# Patient Record
Sex: Male | Born: 1985 | Hispanic: Yes | Smoking: Never smoker
Health system: Southern US, Community
[De-identification: ages and names within clinical notes are randomized; demographics above are authoritative.]

## PROBLEM LIST (undated history)

## (undated) DIAGNOSIS — L290 Pruritus ani: Secondary | ICD-10-CM

## (undated) HISTORY — DX: Pruritus ani: L29.0

---

## 2004-07-25 HISTORY — PX: OTHER SURGICAL HISTORY: SHX169

## 2008-05-22 ENCOUNTER — Emergency Department (HOSPITAL_COMMUNITY): Admission: EM | Admit: 2008-05-22 | Discharge: 2008-05-22 | Payer: Self-pay | Admitting: Emergency Medicine

## 2008-05-23 ENCOUNTER — Emergency Department (HOSPITAL_COMMUNITY): Admission: EM | Admit: 2008-05-23 | Discharge: 2008-05-23 | Payer: Self-pay | Admitting: Emergency Medicine

## 2009-08-29 IMAGING — CR DG CHEST 2V
2 series · 2 of 2 positions shown · non-contrast
Comparison: None

CLINICAL DATA: Fever, cough

CHEST - 2 VIEW

[w chest pa]
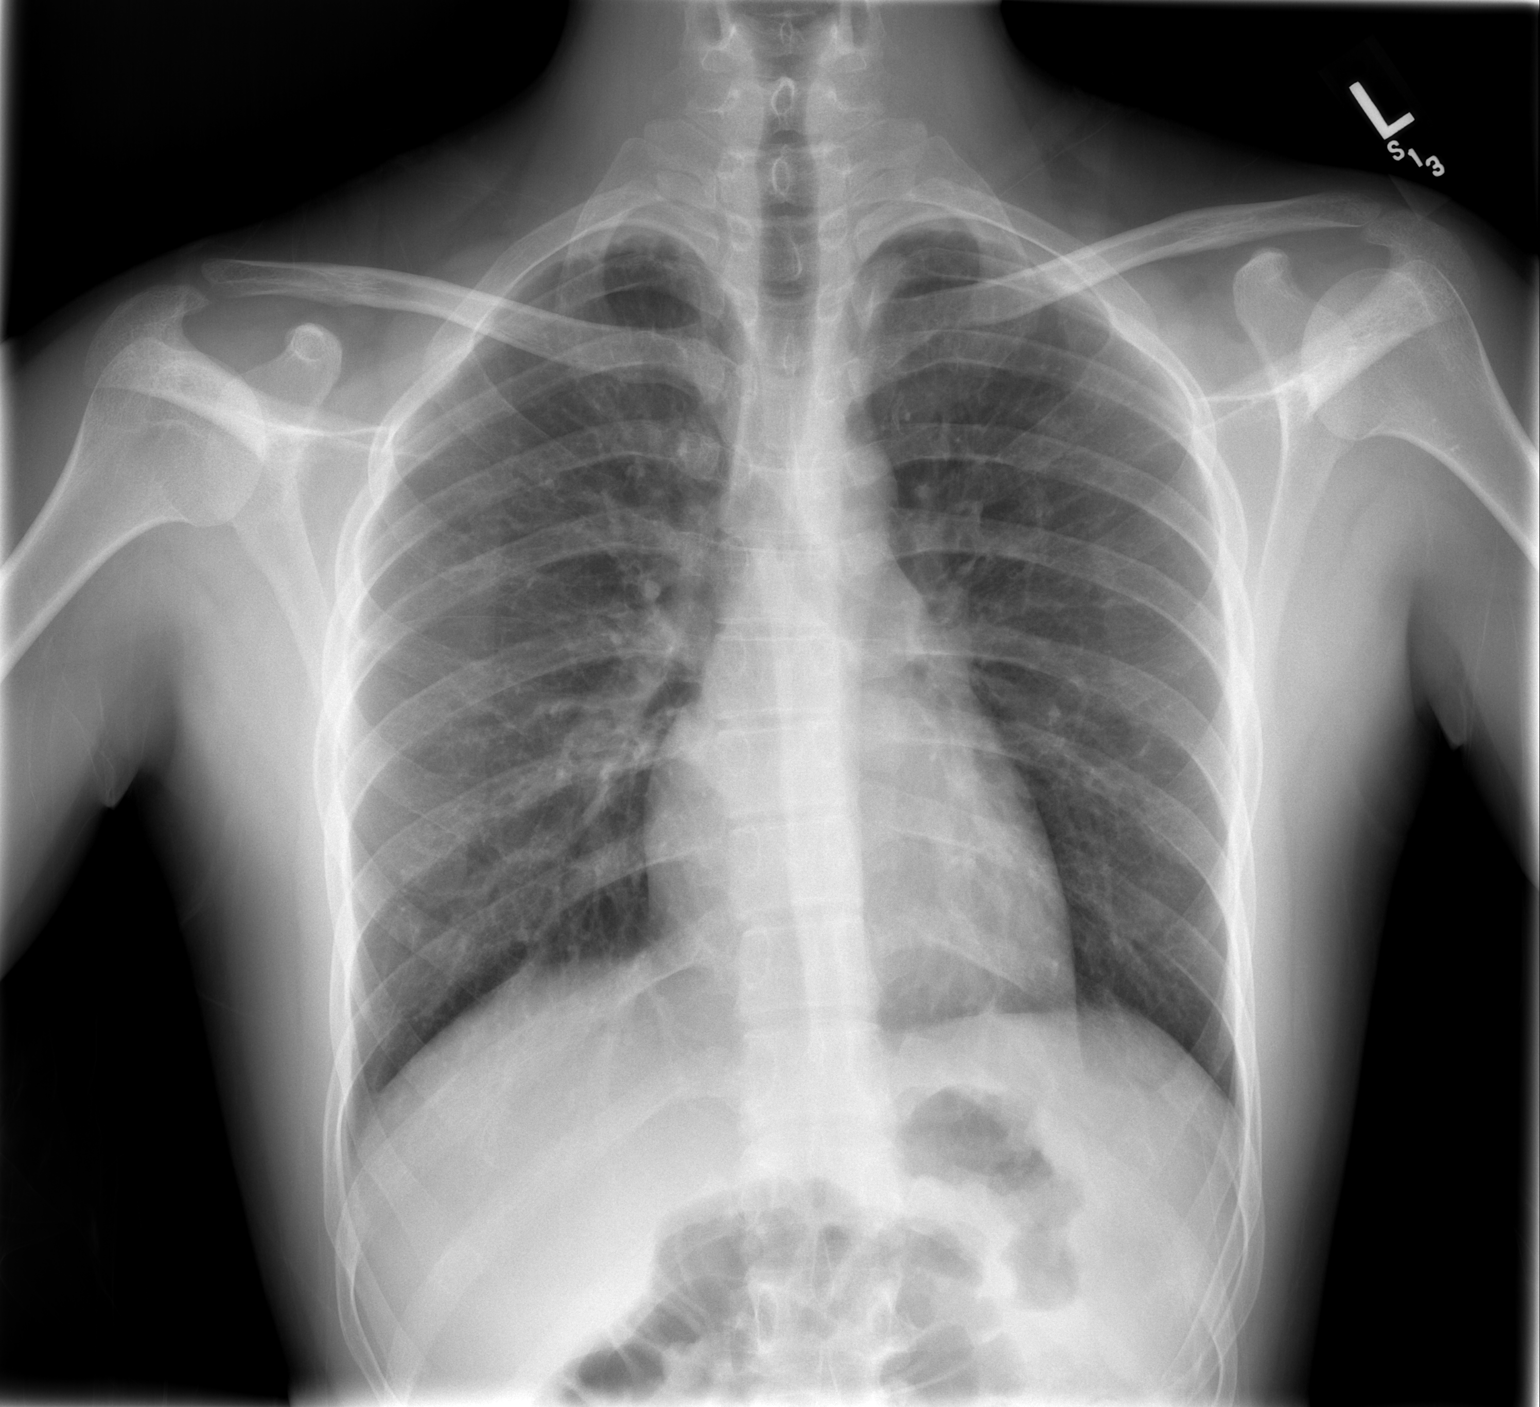

[w chest lat]
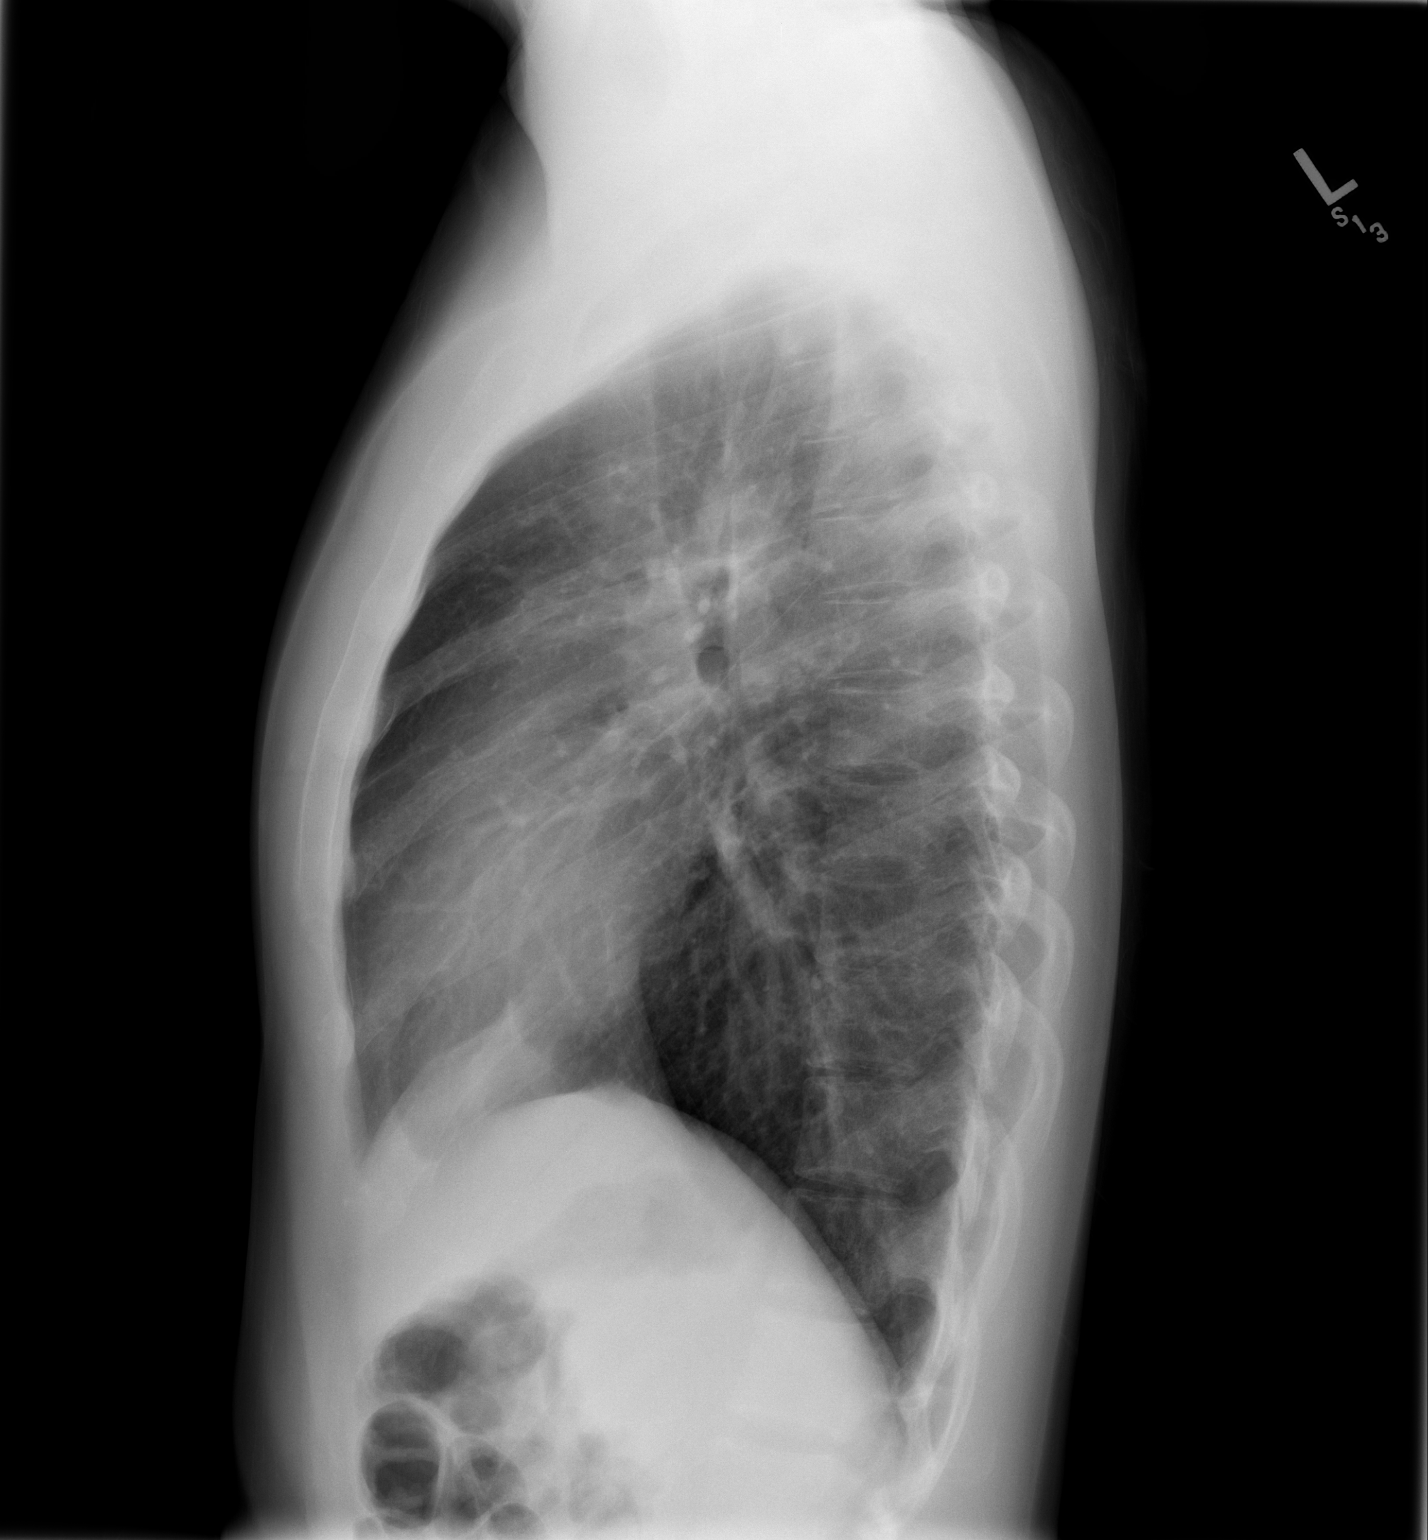

[2 of 2 positions shown; findings below may reference images not displayed]

FINDINGS: Normal mediastinum and cardiac silhouette.  Costophrenic
angles are clear.  No evidence of effusion, infiltrate or
pneumothorax.  Mild coarsened central bronchovascular markings.
IMPRESSION: Mild coarsened central bronchovascular markings could suggest viral
process.  No evidence of focal consolidation.

## 2011-11-15 ENCOUNTER — Ambulatory Visit (INDEPENDENT_AMBULATORY_CARE_PROVIDER_SITE_OTHER): Payer: Managed Care, Other (non HMO) | Admitting: Urology

## 2011-11-15 DIAGNOSIS — N411 Chronic prostatitis: Secondary | ICD-10-CM

## 2011-11-15 DIAGNOSIS — N529 Male erectile dysfunction, unspecified: Secondary | ICD-10-CM

## 2012-01-20 ENCOUNTER — Ambulatory Visit: Payer: Managed Care, Other (non HMO) | Admitting: Urology

## 2014-03-20 ENCOUNTER — Emergency Department (HOSPITAL_COMMUNITY): Payer: BC Managed Care – PPO

## 2014-03-20 ENCOUNTER — Emergency Department (HOSPITAL_COMMUNITY)
Admission: EM | Admit: 2014-03-20 | Discharge: 2014-03-20 | Disposition: A | Discharge status: Home / Self Care | Payer: BC Managed Care – PPO | Attending: Emergency Medicine | Admitting: Emergency Medicine

## 2014-03-20 ENCOUNTER — Encounter (HOSPITAL_COMMUNITY): Payer: Self-pay | Admitting: Emergency Medicine

## 2014-03-20 DIAGNOSIS — R071 Chest pain on breathing: Secondary | ICD-10-CM | POA: Insufficient documentation

## 2014-03-20 DIAGNOSIS — R079 Chest pain, unspecified: Secondary | ICD-10-CM | POA: Insufficient documentation

## 2014-03-20 DIAGNOSIS — R0789 Other chest pain: Secondary | ICD-10-CM

## 2014-03-20 LAB — I-STAT TROPONIN, ED: Troponin i, poc: 0 ng/mL (ref 0.00–0.08)

## 2014-03-20 LAB — I-STAT CHEM 8, ED
BUN: 12 mg/dL (ref 6–23)
CHLORIDE: 103 meq/L (ref 96–112)
CREATININE: 0.8 mg/dL (ref 0.50–1.35)
Calcium, Ion: 1.19 mmol/L (ref 1.12–1.23)
GLUCOSE: 100 mg/dL — AB (ref 70–99)
HCT: 47 % (ref 39.0–52.0)
HEMOGLOBIN: 16 g/dL (ref 13.0–17.0)
POTASSIUM: 3.5 meq/L — AB (ref 3.7–5.3)
SODIUM: 139 meq/L (ref 137–147)
TCO2: 25 mmol/L (ref 0–100)

## 2014-03-20 LAB — D-DIMER, QUANTITATIVE: D-Dimer, Quant: <0.27 ug/mL-FEU (ref 0.00–0.48)

## 2014-03-20 LAB — TROPONIN I

## 2014-03-20 MED ORDER — TRAMADOL HCL 50 MG PO TABS: 50.0000 mg | ORAL_TABLET | Freq: Four times a day (QID) | ORAL | Status: DC | PRN

## 2014-03-20 MED ORDER — IBUPROFEN 800 MG PO TABS: 800.0000 mg | ORAL_TABLET | Freq: Three times a day (TID) | ORAL | Status: DC

## 2014-03-20 NOTE — Discharge Instructions (Signed)

## 2014-03-20 NOTE — ED Provider Notes (Signed)
CSN: [id number]     Arrival date & time 03/20/14  1523 History   First MD Initiated Contact with Patient 03/20/14 1818     Chief Complaint  Patient presents with  . Chest Pain     (Consider location/radiation/quality/duration/timing/severity/associated sxs/prior Treatment) HPI Comments: Patient presents to ER for evaluation of chest pain. Symptoms began early this morning. Pain has been continuous through the day. Patient reports a constant pain in the left chest. He has not had any shortness of breath. Patient does, however, report that he notices the pain worsens when he takes a deep breath. Also, however, worsens if he bends and turns to the left. He denies any injury to the chest.  Patient is a 28 y.o. male presenting with chest pain.  Chest Pain   History reviewed. No pertinent past medical history. No past surgical history on file. No family history on file. History  Substance Use Topics  . Smoking status: Not on file  . Smokeless tobacco: Not on file  . Alcohol Use: Not on file    Review of Systems  Cardiovascular: Positive for chest pain.  All other systems reviewed and are negative.     Allergies  Review of patient's allergies indicates no known allergies.  Home Medications   Prior to Admission medications   Not on File   BP 122/73  Pulse 67  Temp(Src) 98.5 F (36.9 C) (Oral)  Resp 20  SpO2 99% Physical Exam  Constitutional: He is oriented to person, place, and time. He appears well-developed and well-nourished. No distress.  HENT:  Head: Normocephalic and atraumatic.  Right Ear: Hearing normal.  Left Ear: Hearing normal.  Nose: Nose normal.  Mouth/Throat: Oropharynx is clear and moist and mucous membranes are normal.  Eyes: Conjunctivae and EOM are normal. Pupils are equal, round, and reactive to light.  Neck: Normal range of motion. Neck supple.  Cardiovascular: Regular rhythm, S1 normal and S2 normal.  Exam reveals no gallop and no friction rub.    No murmur heard. Pulmonary/Chest: Effort normal and breath sounds normal. No respiratory distress. He exhibits no tenderness.  Abdominal: Soft. Normal appearance and bowel sounds are normal. There is no hepatosplenomegaly. There is no tenderness. There is no rebound, no guarding, no tenderness at McBurney's point and negative Murphy's sign. No hernia.  Musculoskeletal: Normal range of motion.  Neurological: He is alert and oriented to person, place, and time. He has normal strength. No cranial nerve deficit or sensory deficit. Coordination normal. GCS eye subscore is 4. GCS verbal subscore is 5. GCS motor subscore is 6.  Skin: Skin is warm, dry and intact. No rash noted. No cyanosis.  Psychiatric: He has a normal mood and affect. His speech is normal and behavior is normal. Thought content normal.    ED Course  Procedures (including critical care time) Labs Review Labs Reviewed  I-STAT CHEM 8, ED - Abnormal; Notable for the following:    Potassium 3.5 (*)    Glucose, Bld 100 (*)    All other components within normal limits  D-DIMER, QUANTITATIVE  TROPONIN I  I-STAT TROPOININ, ED    Imaging Review Dg Chest 2 View  03/20/2014   CLINICAL DATA:  Left chest pain  EXAM: CHEST  2 VIEW  COMPARISON:  05/23/2008  FINDINGS: Heart size is normal. Vascularity normal. Negative for infiltrate or effusion. Slightly prominent lung markings as noted previously appear chronic. Apical pleural scarring bilaterally also unchanged.  IMPRESSION: No active cardiopulmonary disease.   Electronically Signed  By: Marlan Palau M.D.   On: 03/20/2014 18:10     EKG Interpretation   Date/Time:  Thursday March 20 2014 15:32:07 EDT Ventricular Rate:  59 PR Interval:  126 QRS Duration: 86 QT Interval:  406 QTC Calculation: 401 R Axis:   73 Text Interpretation:  Sinus bradycardia Otherwise normal ECG Confirmed by  POLLINA  MD, CHRISTOPHER [phone]) on 03/20/2014 6:19:00 PM      MDM   Final diagnoses:   Chest wall pain    Patient presents to the ER for evaluation of chest pain. Patient has no cardiac risk factors. I was not able to reproduce the pain by palpation, but it is reproducible with turning and bending over to the left side. This is most consistent with musculoskeletal chest pain. The patient's EKG is normal. Troponin was negative after pain lasting the entire day. D-dimer negative. Well's/PERC negative. He is reassured, treat for musculoskeletal chest pain. Return if symptoms worsen.    Gilda Crease, MD 03/20/14 2226

## 2014-03-20 NOTE — ED Notes (Signed)
Pt in c/o central chest pain since this morning, denies other symptoms, no distress noted, went to an urgent care and was told to come here for further evaluation

## 2014-03-20 NOTE — ED Notes (Signed)
Spoke with MD Gwendolyn Grant and he stated to wait for patient to be evaluated to order blood work

## 2014-10-20 ENCOUNTER — Encounter: Payer: Self-pay | Admitting: Family Medicine

## 2014-10-20 ENCOUNTER — Ambulatory Visit (INDEPENDENT_AMBULATORY_CARE_PROVIDER_SITE_OTHER): Payer: BLUE CROSS/BLUE SHIELD | Admitting: Family Medicine

## 2014-10-20 VITALS — BP 127/85 | HR 66 | Temp 98.6°F | Ht 66.0 in | Wt 144.0 lb

## 2014-10-20 DIAGNOSIS — K602 Anal fissure, unspecified: Secondary | ICD-10-CM | POA: Diagnosis not present

## 2014-10-20 DIAGNOSIS — L290 Pruritus ani: Secondary | ICD-10-CM

## 2014-10-20 MED ORDER — HYDROCORTISONE 2.5 % RE CREA: TOPICAL_CREAM | RECTAL | Status: DC

## 2014-10-20 MED ORDER — DOCUSATE SODIUM 100 MG PO CAPS: ORAL_CAPSULE | ORAL | Status: DC

## 2014-10-20 NOTE — Progress Notes (Signed)
Pre visit review using our clinic review tool, if applicable. No additional management support is needed unless otherwise documented below in the visit note. 
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
## 2014-10-20 NOTE — Patient Instructions (Signed)
Avoid tight-fitting underwear, keep anal area as dry as possible.  Limit intake or avoid intake of tea, tomatoes, citrus fruits, chocolate, cola, coffee, and beer: these substances can sometimes cause worsening of anal itching.

## 2014-12-01 ENCOUNTER — Encounter: Payer: Self-pay | Admitting: Family Medicine

## 2014-12-01 ENCOUNTER — Ambulatory Visit (INDEPENDENT_AMBULATORY_CARE_PROVIDER_SITE_OTHER): Payer: BLUE CROSS/BLUE SHIELD | Admitting: Family Medicine

## 2014-12-01 VITALS — BP 132/78 | HR 78 | Temp 99.2°F | Resp 16 | Wt 147.0 lb

## 2014-12-01 DIAGNOSIS — L290 Pruritus ani: Secondary | ICD-10-CM

## 2014-12-01 NOTE — Progress Notes (Signed)
OFFICE NOTE  12/01/2014  CC:  Chief Complaint  Patient presents with  . Follow-up    6 week f/u     HPI: Patient is a 29 y.o. Hispanic male who is here for 5 week f/u anal pruritis. Doing better.  Changed to hypoallergenic/nonscented soap + using anusol cream avg 2-3 times per week and gets decent relief of sx's.  Gets occ BRB still but less often than before.    No hard/large BMs.  Pertinent PMH:  Past medical, surgical, social, and family history reviewed and no changes are noted since last office visit.  MEDS:  Outpatient Prescriptions Prior to Visit  Medication Sig Dispense Refill  . docusate sodium (COLACE) 100 MG capsule 2 caps po qd 60 capsule 11  . hydrocortisone (ANUSOL-HC) 2.5 % rectal cream Apply to anal area bid prn itching 30 g 5   No facility-administered medications prior to visit.    PE: Blood pressure 132/78, pulse 78, temperature 99.2 F (37.3 C), temperature source Temporal, resp. rate 16, weight 147 lb (66.679 kg), SpO2 95 %. Gen: Alert, well appearing.  Patient is oriented to person, place, time, and situation. No further exam today.  IMPRESSION AND PLAN:  1) Anal pruritis: improved. Continue current measures. He has rf's on anusol x 5.  An After Visit Summary was printed and given to the patient.  FOLLOW UP: prn

## 2014-12-01 NOTE — Progress Notes (Signed)
Pre visit review using our clinic review tool, if applicable. No additional management support is needed unless otherwise documented below in the visit note. 

## 2015-06-26 IMAGING — CR DG CHEST 2V
2 series · 2 of 2 positions shown · non-contrast
Comparison: 05/23/2008

CLINICAL DATA: Left chest pain

EXAM:
CHEST  2 VIEW

[w chest pa]
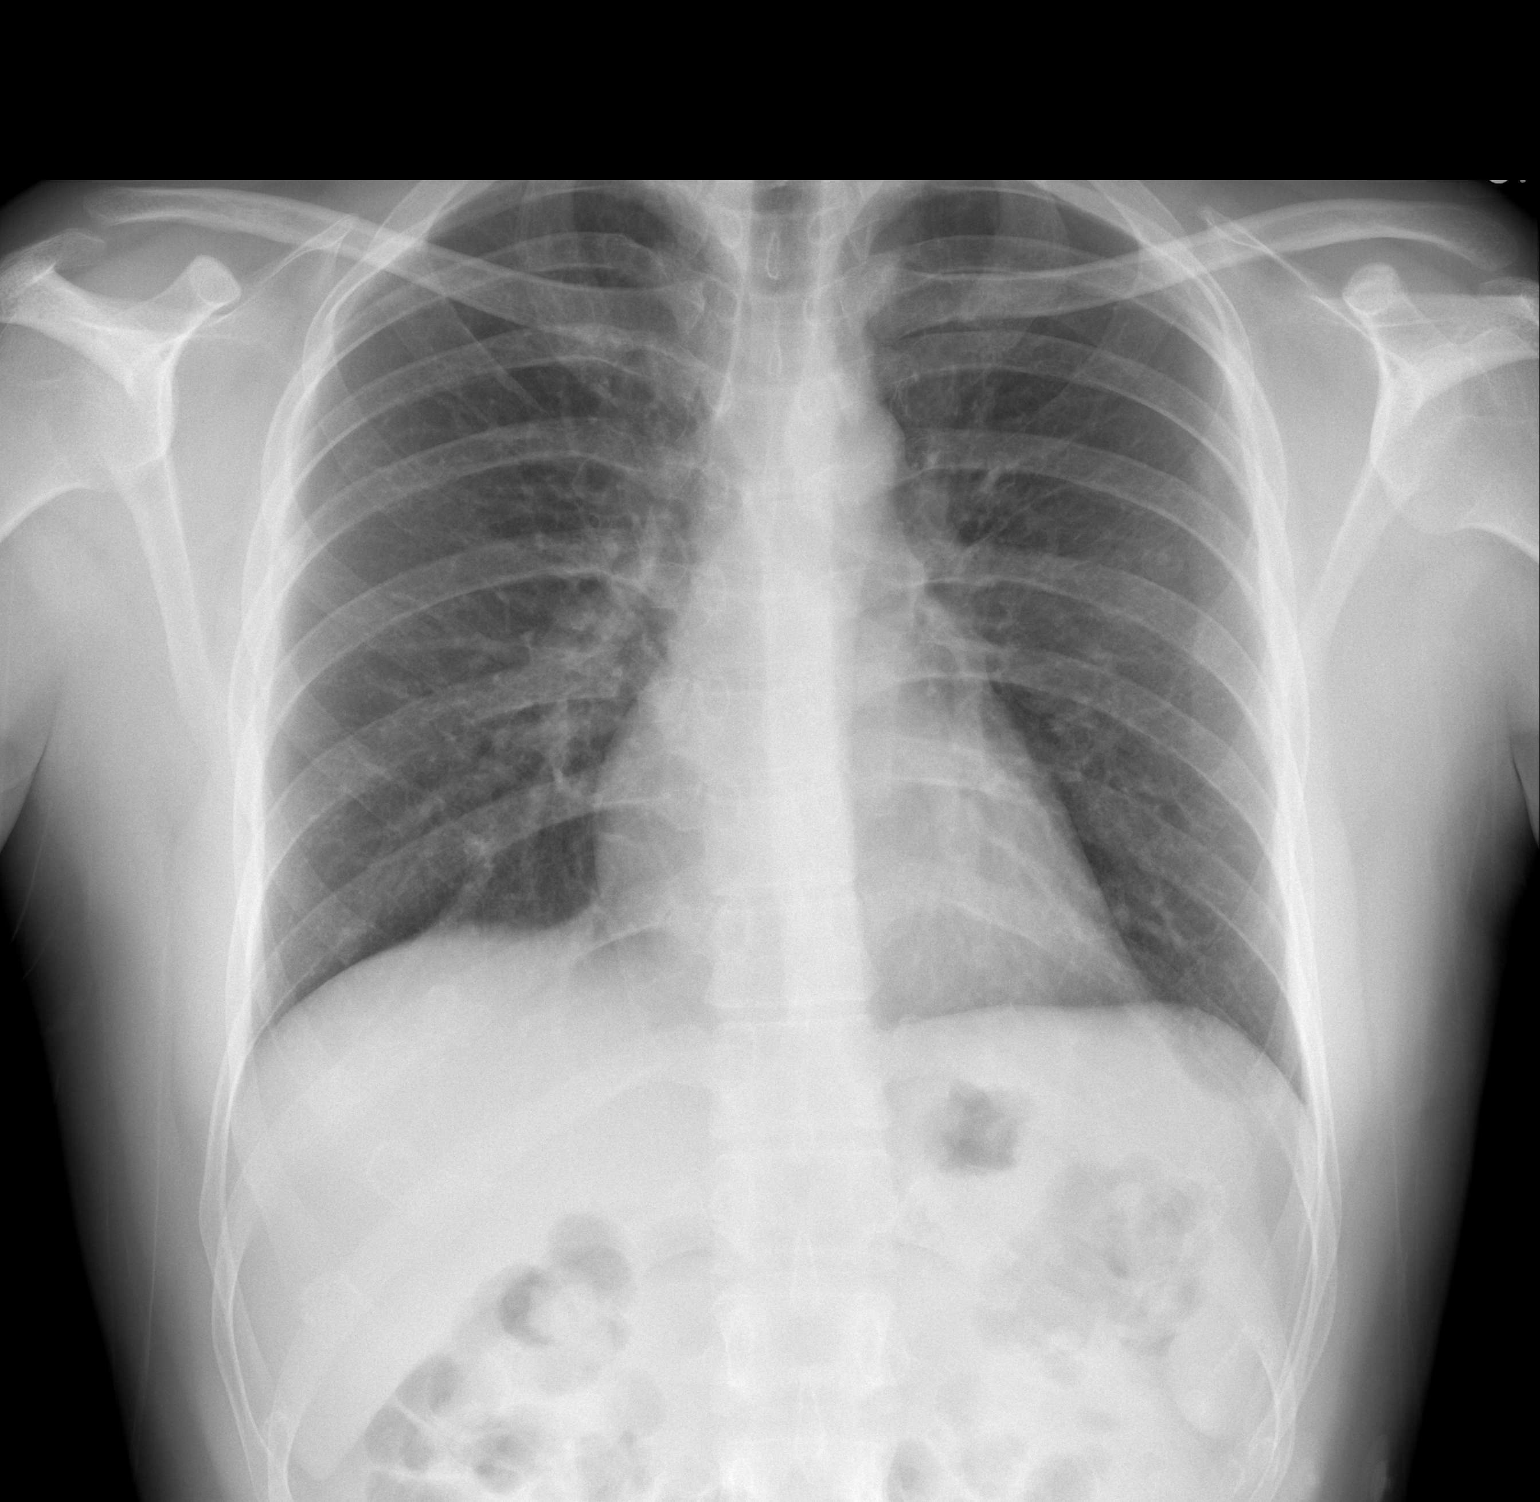

[w chest lat]
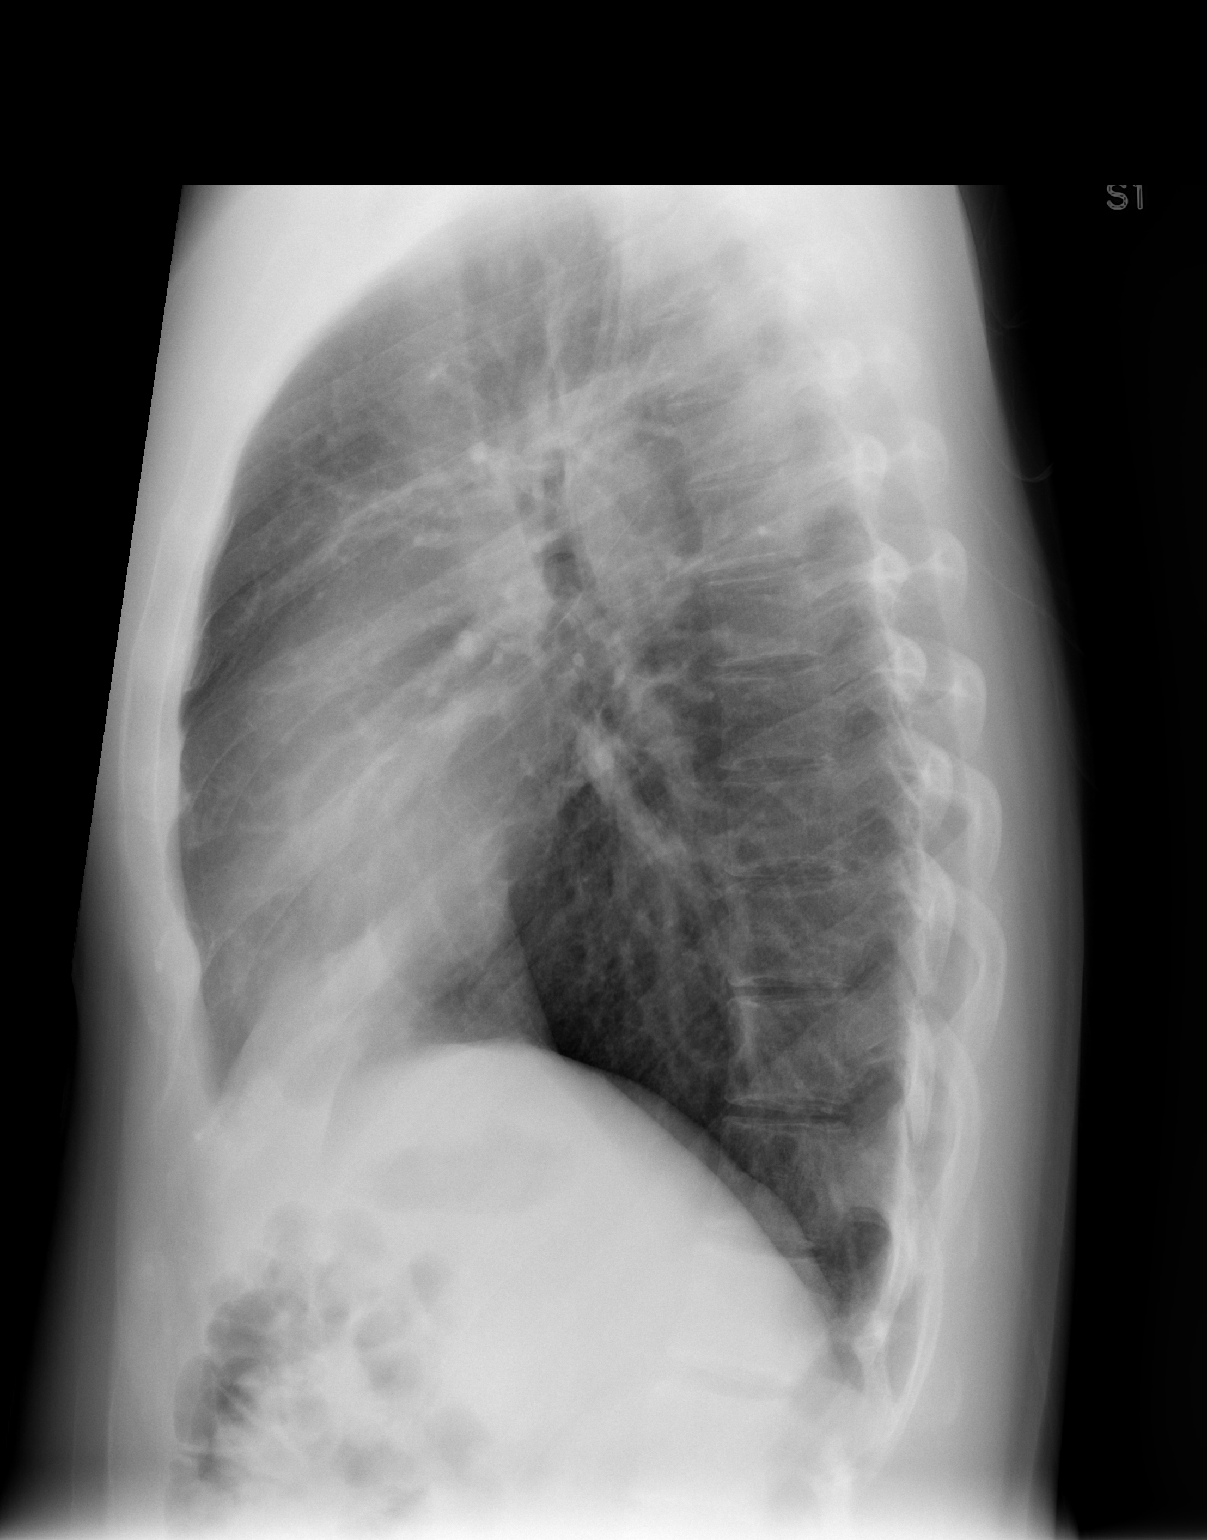

[2 of 2 positions shown; findings below may reference images not displayed]

FINDINGS: Heart size is normal. Vascularity normal. Negative for infiltrate or
effusion. Slightly prominent lung markings as noted previously
appear chronic. Apical pleural scarring bilaterally also unchanged.
IMPRESSION: No active cardiopulmonary disease.

## 2017-01-08 DIAGNOSIS — S92144A Nondisplaced dome fracture of right talus, initial encounter for closed fracture: Secondary | ICD-10-CM | POA: Diagnosis not present

## 2017-01-11 DIAGNOSIS — S92254A Nondisplaced fracture of navicular [scaphoid] of right foot, initial encounter for closed fracture: Secondary | ICD-10-CM | POA: Diagnosis not present

## 2017-02-10 DIAGNOSIS — S92254D Nondisplaced fracture of navicular [scaphoid] of right foot, subsequent encounter for fracture with routine healing: Secondary | ICD-10-CM | POA: Diagnosis not present

## 2017-04-05 DIAGNOSIS — S92254D Nondisplaced fracture of navicular [scaphoid] of right foot, subsequent encounter for fracture with routine healing: Secondary | ICD-10-CM | POA: Diagnosis not present

## 2017-05-03 DIAGNOSIS — R509 Fever, unspecified: Secondary | ICD-10-CM | POA: Diagnosis not present

## 2017-05-15 DIAGNOSIS — R509 Fever, unspecified: Secondary | ICD-10-CM | POA: Diagnosis not present

## 2017-06-08 ENCOUNTER — Ambulatory Visit (INDEPENDENT_AMBULATORY_CARE_PROVIDER_SITE_OTHER): Payer: BLUE CROSS/BLUE SHIELD | Admitting: Adult Health

## 2017-06-08 ENCOUNTER — Encounter: Payer: Self-pay | Admitting: Adult Health

## 2017-06-08 VITALS — BP 140/90 | Temp 98.3°F | Ht 66.0 in | Wt 150.0 lb

## 2017-06-08 DIAGNOSIS — Z7689 Persons encountering health services in other specified circumstances: Secondary | ICD-10-CM

## 2017-06-08 DIAGNOSIS — N529 Male erectile dysfunction, unspecified: Secondary | ICD-10-CM

## 2017-06-08 MED ORDER — NYSTATIN 100000 UNIT/GM EX CREA: 1.0000 "application " | TOPICAL_CREAM | Freq: Two times a day (BID) | CUTANEOUS | 1 refills | Status: DC

## 2017-06-08 NOTE — Patient Instructions (Signed)
It was great meeting you today   Please start exercising and eating healthier   Follow up with me for your physical exam
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
## 2017-06-14 ENCOUNTER — Encounter: Payer: BLUE CROSS/BLUE SHIELD | Admitting: Adult Health

## 2017-07-05 ENCOUNTER — Encounter: Payer: BLUE CROSS/BLUE SHIELD | Admitting: Adult Health

## 2017-07-20 ENCOUNTER — Encounter: Payer: Self-pay | Admitting: Adult Health

## 2017-07-20 ENCOUNTER — Ambulatory Visit (INDEPENDENT_AMBULATORY_CARE_PROVIDER_SITE_OTHER): Payer: BLUE CROSS/BLUE SHIELD | Admitting: Adult Health

## 2017-07-20 VITALS — BP 116/70 | Temp 98.5°F | Ht 66.0 in | Wt 149.0 lb

## 2017-07-20 DIAGNOSIS — Z0000 Encounter for general adult medical examination without abnormal findings: Secondary | ICD-10-CM

## 2017-07-20 DIAGNOSIS — Z114 Encounter for screening for human immunodeficiency virus [HIV]: Secondary | ICD-10-CM | POA: Diagnosis not present

## 2017-07-20 DIAGNOSIS — N529 Male erectile dysfunction, unspecified: Secondary | ICD-10-CM | POA: Diagnosis not present

## 2017-07-20 LAB — BASIC METABOLIC PANEL
BUN: 19 mg/dL (ref 6–23)
CALCIUM: 9.7 mg/dL (ref 8.4–10.5)
CHLORIDE: 101 meq/L (ref 96–112)
CO2: 31 meq/L (ref 19–32)
Creatinine, Ser: 0.83 mg/dL (ref 0.40–1.50)
GFR: 113.5 mL/min (ref >60.00)
Glucose, Bld: 100 mg/dL — ABNORMAL HIGH (ref 70–99)
Potassium: 4.7 mEq/L (ref 3.5–5.1)
SODIUM: 139 meq/L (ref 135–145)

## 2017-07-20 LAB — CBC WITH DIFFERENTIAL/PLATELET
BASOS ABS: 0 10*3/uL (ref 0.0–0.1)
Basophils Relative: 0.3 % (ref 0.0–3.0)
Eosinophils Absolute: 0.1 10*3/uL (ref 0.0–0.7)
Eosinophils Relative: 1 % (ref 0.0–5.0)
HEMATOCRIT: 48.2 % (ref 39.0–52.0)
HEMOGLOBIN: 15.5 g/dL (ref 13.0–17.0)
LYMPHS PCT: 37.3 % (ref 12.0–46.0)
Lymphs Abs: 3.4 10*3/uL (ref 0.7–4.0)
MCHC: 32.2 g/dL (ref 30.0–36.0)
MCV: 82.5 fl (ref 78.0–100.0)
MONOS PCT: 7.6 % (ref 3.0–12.0)
Monocytes Absolute: 0.7 10*3/uL (ref 0.1–1.0)
NEUTROS ABS: 4.9 10*3/uL (ref 1.4–7.7)
Neutrophils Relative %: 53.8 % (ref 43.0–77.0)
PLATELETS: 250 10*3/uL (ref 150.0–400.0)
RBC: 5.84 Mil/uL — AB (ref 4.22–5.81)
RDW: 14 % (ref 11.5–15.5)
WBC: 9 10*3/uL (ref 4.0–10.5)

## 2017-07-20 LAB — HEPATIC FUNCTION PANEL
ALBUMIN: 4.6 g/dL (ref 3.5–5.2)
ALK PHOS: 54 U/L (ref 39–117)
ALT: 25 U/L (ref 0–53)
AST: 17 U/L (ref 0–37)
BILIRUBIN DIRECT: 0.1 mg/dL (ref 0.0–0.3)
TOTAL PROTEIN: 7.1 g/dL (ref 6.0–8.3)
Total Bilirubin: 0.6 mg/dL (ref 0.2–1.2)

## 2017-07-20 LAB — TSH: TSH: 1.12 u[IU]/mL (ref 0.35–4.50)

## 2017-07-20 LAB — HEMOGLOBIN A1C: HEMOGLOBIN A1C: 5.9 % (ref 4.6–6.5)

## 2017-07-20 LAB — LIPID PANEL
CHOL/HDL RATIO: 5
Cholesterol: 203 mg/dL — ABNORMAL HIGH (ref 0–200)
HDL: 42.7 mg/dL (ref >39.00)
LDL Cholesterol: 130 mg/dL — ABNORMAL HIGH (ref 0–99)
NONHDL: 159.84
Triglycerides: 147 mg/dL (ref 0.0–149.0)
VLDL: 29.4 mg/dL (ref 0.0–40.0)
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
## 2017-07-20 NOTE — Patient Instructions (Signed)
It was great seeing you today   I will follow up with you regarding your labs   Please let me know if you need anything   Health Maintenance, Male A healthy lifestyle and preventative care can promote health and wellness.  Maintain regular health, dental, and eye exams.  Eat a healthy diet. Foods like vegetables, fruits, whole grains, low-fat dairy products, and lean protein foods contain the nutrients you need and are low in calories. Decrease your intake of foods high in solid fats, added sugars, and salt. Get information about a proper diet from your health care provider, if necessary.  Regular physical exercise is one of the most important things you can do for your health. Most adults should get at least 150 minutes of moderate-intensity exercise (any activity that increases your heart rate and causes you to sweat) each week. In addition, most adults need muscle-strengthening exercises on 2 or more days a week.   Maintain a healthy weight. The body mass index (BMI) is a screening tool to identify possible weight problems. It provides an estimate of body fat based on height and weight. Your health care provider can find your BMI and can help you achieve or maintain a healthy weight. For males 20 years and older:  A BMI below 18.5 is considered underweight.  A BMI of 18.5 to 24.9 is normal.  A BMI of 25 to 29.9 is considered overweight.  A BMI of 30 and above is considered obese.  Maintain normal blood lipids and cholesterol by exercising and minimizing your intake of saturated fat. Eat a balanced diet with plenty of fruits and vegetables. Blood tests for lipids and cholesterol should begin at age 32 and be repeated every 5 years. If your lipid or cholesterol levels are high, you are over age 32, or you are at high risk for heart disease, you may need your cholesterol levels checked more frequently.Ongoing high lipid and cholesterol levels should be treated with medicines if diet and  exercise are not working.  If you smoke, find out from your health care provider how to quit. If you do not use tobacco, do not start.  Lung cancer screening is recommended for adults aged 55-80 years who are at high risk for developing lung cancer because of a history of smoking. A yearly low-dose CT scan of the lungs is recommended for people who have at least a 30-pack-year history of smoking and are current smokers or have quit within the past 15 years. A pack year of smoking is smoking an average of 1 pack of cigarettes a day for 1 year (for example, a 30-pack-year history of smoking could mean smoking 1 pack a day for 30 years or 2 packs a day for 15 years). Yearly screening should continue until the smoker has stopped smoking for at least 15 years. Yearly screening should be stopped for people who develop a health problem that would prevent them from having lung cancer treatment.  If you choose to drink alcohol, do not have more than 2 drinks per day. One drink is considered to be 12 oz (360 mL) of beer, 5 oz (150 mL) of wine, or 1.5 oz (45 mL) of liquor.  Avoid the use of street drugs. Do not share needles with anyone. Ask for help if you need support or instructions about stopping the use of drugs.  High blood pressure causes heart disease and increases the risk of stroke. High blood pressure is more likely to develop in:  People who have blood pressure in the end of the normal range (100-139/85-89 mm Hg).  People who are overweight or obese.  People who are African American.  If you are 65-61 years of age, have your blood pressure checked every 3-5 years. If you are 68 years of age or older, have your blood pressure checked every year. You should have your blood pressure measured twice--once when you are at a hospital or clinic, and once when you are not at a hospital or clinic. Record the average of the two measurements. To check your blood pressure when you are not at a hospital or  clinic, you can use:  An automated blood pressure machine at a pharmacy.  A home blood pressure monitor.  If you are 21-21 years old, ask your health care provider if you should take aspirin to prevent heart disease.  Diabetes screening involves taking a blood sample to check your fasting blood sugar level. This should be done once every 3 years after age 67 if you are at a normal weight and without risk factors for diabetes. Testing should be considered at a younger age or be carried out more frequently if you are overweight and have at least 1 risk factor for diabetes.  Colorectal cancer can be detected and often prevented. Most routine colorectal cancer screening begins at the age of 24 and continues through age 7. However, your health care provider may recommend screening at an earlier age if you have risk factors for colon cancer. On a yearly basis, your health care provider may provide home test kits to check for hidden blood in the stool. A small camera at the end of a tube may be used to directly examine the colon (sigmoidoscopy or colonoscopy) to detect the earliest forms of colorectal cancer. Talk to your health care provider about this at age 24 when routine screening begins. A direct exam of the colon should be repeated every 5-10 years through age 61, unless early forms of precancerous polyps or small growths are found.  People who are at an increased risk for hepatitis B should be screened for this virus. You are considered at high risk for hepatitis B if:  You were born in a country where hepatitis B occurs often. Talk with your health care provider about which countries are considered high risk.  Your parents were born in a high-risk country and you have not received a shot to protect against hepatitis B (hepatitis B vaccine).  You have HIV or AIDS.  You use needles to inject street drugs.  You live with, or have sex with, someone who has hepatitis B.  You are a man who has  sex with other men (MSM).  You get hemodialysis treatment.  You take certain medicines for conditions like cancer, organ transplantation, and autoimmune conditions.  Hepatitis C blood testing is recommended for all people born from [birth year] through 1966 and any individual with known risk factors for hepatitis C.  Healthy men should no longer receive prostate-specific antigen (PSA) blood tests as part of routine cancer screening. Talk to your health care provider about prostate cancer screening.  Testicular cancer screening is not recommended for adolescents or adult males who have no symptoms. Screening includes self-exam, a health care provider exam, and other screening tests. Consult with your health care provider about any symptoms you have or any concerns you have about testicular cancer.  Practice safe sex. Use condoms and avoid high-risk sexual practices to reduce the spread of  sexually transmitted infections (STIs).  You should be screened for STIs, including gonorrhea and chlamydia if:  You are sexually active and are younger than 24 years.  You are older than 24 years, and your health care provider tells you that you are at risk for this type of infection.  Your sexual activity has changed since you were last screened, and you are at an increased risk for chlamydia or gonorrhea. Ask your health care provider if you are at risk.  If you are at risk of being infected with HIV, it is recommended that you take a prescription medicine daily to prevent HIV infection. This is called pre-exposure prophylaxis (PrEP). You are considered at risk if:  You are a man who has sex with other men (MSM).  You are a heterosexual man who is sexually active with multiple partners.  You take drugs by injection.  You are sexually active with a partner who has HIV.  Talk with your health care provider about whether you are at high risk of being infected with HIV. If you choose to begin PrEP, you should  first be tested for HIV. You should then be tested every 3 months for as long as you are taking PrEP.  Use sunscreen. Apply sunscreen liberally and repeatedly throughout the day. You should seek shade when your shadow is shorter than you. Protect yourself by wearing long sleeves, pants, a wide-brimmed hat, and sunglasses year round whenever you are outdoors.  Tell your health care provider of new moles or changes in moles, especially if there is a change in shape or color. Also, tell your health care provider if a mole is larger than the size of a pencil eraser.  A one-time screening for abdominal aortic aneurysm (AAA) and surgical repair of large AAAs by ultrasound is recommended for men aged 52-75 years who are current or former smokers.  Stay current with your vaccines (immunizations).   This information is not intended to replace advice given to you by your health care provider. Make sure you discuss any questions you have with your health care provider.   Document Released: 01/07/2008 Document Revised: 08/01/2014 Document Reviewed: 12/06/2010 Elsevier Interactive Patient Education [employer].

## 2017-07-20 NOTE — Addendum Note (Signed)
Addended by: Bonnye FavaKWEI, Graciela Plato K on: 07/20/2017 07:39 AM   Modules accepted: Orders

## 2017-07-21 ENCOUNTER — Telehealth: Payer: Self-pay | Admitting: Adult Health

## 2017-07-21 LAB — TESTOSTERONE TOTAL,FREE,BIO, MALES
Albumin: 4.6 g/dL (ref 3.6–5.1)
Sex Hormone Binding: 17 nmol/L (ref 10–50)
Testosterone, Bioavailable: 151.6 ng/dL (ref >=110.0)
Testosterone, Free: 72.2 pg/mL (ref 46.0–224.0)
Testosterone: 348 ng/dL (ref 250–827)

## 2017-07-21 LAB — HIV ANTIBODY (ROUTINE TESTING W REFLEX): HIV: NONREACTIVE

## 2017-07-21 NOTE — Telephone Encounter (Signed)
Updated patient on his labs  

## 2017-09-06 ENCOUNTER — Ambulatory Visit (INDEPENDENT_AMBULATORY_CARE_PROVIDER_SITE_OTHER): Payer: BLUE CROSS/BLUE SHIELD | Admitting: Family Medicine

## 2017-09-06 ENCOUNTER — Encounter: Payer: Self-pay | Admitting: Family Medicine

## 2017-09-06 VITALS — BP 124/66 | HR 73 | Temp 98.2°F | Resp 12 | Ht 66.0 in | Wt 143.0 lb

## 2017-09-06 DIAGNOSIS — L639 Alopecia areata, unspecified: Secondary | ICD-10-CM

## 2017-09-06 MED ORDER — DESOXIMETASONE 0.25 % EX CREA: 1.0000 "application " | TOPICAL_CREAM | Freq: Two times a day (BID) | CUTANEOUS | 0 refills | Status: DC

## 2017-09-06 NOTE — Progress Notes (Signed)
ACUTE VISIT   HPI:  Chief Complaint  Patient presents with  . Alopecia    noticed bald spot in head on Feb 1st after getting hair cut    Kenneth Mcdonald Reason is a 32 y.o. male, who is here today complaining of "bold" area on occipital scalp he noted after hair cut 7-10 days ago. He usually goes to the same person to cut his hair and was not noted during his last hair cut ,1-2 months ago.  He denies scalp lesions,pruritus,trauma,or other areas of hair loss.  No associated fever,chills,myalgias,arthralgias,oral lesions,fatigue, or skin rash.  He has not used OTC products.    Review of Systems  Constitutional: Negative for appetite change, chills, fatigue, fever and unexpected weight change.  HENT: Negative for mouth sores and sore throat.   Respiratory: Negative for cough, shortness of breath and wheezing.   Gastrointestinal: Negative for abdominal pain, nausea and vomiting.  Endocrine: Negative for cold intolerance and heat intolerance.  Musculoskeletal: Negative for arthralgias, joint swelling and myalgias.  Skin: Negative for rash and wound.  Neurological: Negative for weakness, numbness and headaches.  Hematological: Negative for adenopathy. Does not bruise/bleed easily.      Current Outpatient Medications on File Prior to Visit  Medication Sig Dispense Refill  . nystatin cream (MYCOSTATIN) Apply 1 application 2 (two) times daily topically. 30 g 1   No current facility-administered medications on file prior to visit.      History reviewed. No pertinent past medical history. No Known Allergies  Social History   Socioeconomic History  . Marital status: Divorced    Spouse name: None  . Number of children: None  . Years of education: None  . Highest education level: None  Social Needs  . Financial resource strain: None  . Food insecurity - worry: None  . Food insecurity - inability: None  . Transportation needs - medical: None  . Transportation  needs - non-medical: None  Occupational History  . None  Tobacco Use  . Smoking status: Never Smoker  . Smokeless tobacco: Never Used  Substance and Sexual Activity  . Alcohol use: Yes    Alcohol/week: 0.0 oz    Comment: occ  . Drug use: No  . Sexual activity: None  Other Topics Concern  . 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       Vitals:   09/06/17 0957  BP: 124/66  Pulse: 73  Resp: 12  Temp: 98.2 F (36.8 C)  SpO2: 99%   Body mass index is 23.08 kg/m.    Physical Exam  Nursing note and vitals reviewed. Constitutional: He is oriented to person, place, and time. He appears well-developed and well-nourished. No distress.  HENT:  Head: Normocephalic and atraumatic.    Mouth/Throat: Oropharynx is clear and moist and mucous membranes are normal.  Eyes: Conjunctivae are normal. Pupils are equal, round, and reactive to light.  Cardiovascular: Normal rate and regular rhythm.  Respiratory: Effort normal and breath sounds normal. No respiratory distress.  Musculoskeletal: He exhibits no edema or tenderness.  Joints with no deformities or signs of synovitis.  Lymphadenopathy:       Head (right side): No occipital adenopathy present.       Head (left side): No occipital adenopathy present.    He has no cervical adenopathy.  Neurological: He is alert and  oriented to person, place, and time. He has normal strength. Gait normal.  Skin: Skin is warm. No rash noted. No erythema.  Mid left side of occipital scalp with alopecic area, quarter size. No skin lesions,tenderness,or broken hair appreciated.  No other areas of alopecia elsewhere present. Refer to [facility].  Psychiatric: His mood appears anxious.  Well groomed, good eye contact.      ASSESSMENT AND PLAN:  Kenneth Mcdonald was seen today for alopecia.  Diagnoses and all orders for this visit:  Concentric  alopecia areata -     desoximetasone (TOPICORT) 0.25 % cream; Apply 1 application topically 2 (two) times daily.   Educated about Dx,possible etiologies,and treatment options. At this time I am recommending trying topical potent steroid first and if not improvement in 2-3 weeks intralesion steroid can be considered.  Educated about side effects of steroid treatment. Instructed about warning signs. F/U with PCP in 3-4 weeks,before if needed.     Return in about 3 weeks (around 09/27/2017) for PCP.     -Kenneth Mcdonald was advised to seek immediate medical attention if sudden worsening symptoms.      Pavan Bring G. [place], MD  [facility]. [place] office.

## 2017-09-06 NOTE — Patient Instructions (Signed)
Alopecia Areata, Adult  Alopecia areata is a condition that causes you to lose hair. You may lose hair on your scalp in patches. In some cases, you may lose all the hair on your scalp (alopecia totalis) or all the hair from your face and body (alopecia universalis).  Alopecia areata is an autoimmune disease. This means that your body's defense system (immune system) mistakes normal parts of the body for germs or other things that can make you sick. When you have alopecia areata, the immune system attacks the hair follicles.  Alopecia areata usually develops in childhood, but it can develop at any age. For some people, their hair grows back on its own and hair loss does not happen again. For others, their hair may fall out and grow back in cycles. The hair loss may last many years. Having this condition can be emotionally difficult, but it is not dangerous.  What are the causes?  The cause of this condition is not known.  What increases the risk?  This condition is more likely to develop in people who have:   A family history of alopecia.   A family history of another autoimmune disease, including type 1 diabetes and rheumatoid arthritis.   Asthma and allergies.   Down syndrome.    What are the signs or symptoms?  Round spots of patchy hair loss on the scalp is the main symptom of this condition. The spots may be mildly itchy. Other symptoms include:   Short dark hairs in the bald patches that are wider at the top (exclamation point hairs).   Dents, white spots, or lines in the fingernails or toenails.   Balding and body hair loss. This is rare.    How is this diagnosed?  This condition is diagnosed based on your symptoms and family history. Your health care provider will also check your scalp skin, teeth, and nails. Your health care provider may refer you to a specialist in hair and skin disorders (dermatologist). You may also have tests, including:   A hair pull test.   Blood tests or other screening tests  to check for autoimmune diseases, such as thyroid disease or diabetes.   Skin biopsy to confirm the diagnosis.   A procedure to examine the skin with a lighted magnifying instrument (dermoscopy).    How is this treated?  There is no cure for alopecia areata. Treatment is aimed at promoting the regrowth of hair and preventing the immune system from overreacting. No single treatment is right for all people with alopecia areata. It depends on the type of hair loss you have and how severe it is. Work with your health care provider to find the best treatment for you. Treatment may include:   Having regular checkups to make sure the condition is not getting worse (watchful waiting).   Steroid creams or pills for 6-8 weeks to stop the immune reaction and help hair to regrow more quickly.   Other topical medicines to alter the immune system response and support the hair growth cycle.   Steroid injections.   Therapy and counseling with a support group or therapist if you are having trouble coping with hair loss.    Follow these instructions at home:   Learn as much as you can about your condition.   Apply topical creams only as told by your health care provider.   Take over-the-counter and prescription medicines only as told by your health care provider.   Consider getting a wig or   products to make hair look fuller or to cover bald spots, if you feel uncomfortable with your appearance.   Get therapy or counseling if you are having a hard time coping with hair loss. Ask your health care provider to recommend a counselor or support group.   Keep all follow-up visits as told by your health care provider. This is important.  Contact a health care provider if:   Your hair loss gets worse, even with treatment.   You have new symptoms.   You are struggling emotionally.  Summary   Alopecia areata is an autoimmune condition that makes your body's defense system (immune system) attack the hair follicles. This causes  you to lose hair.   Treatments may include regular checkups to make sure that the condition is not getting worse (watchful waiting), medicines, and steroid injections.  This information is not intended to replace advice given to you by your health care provider. Make sure you discuss any questions you have with your health care provider.  Document Released: 02/13/2004 Document Revised: 07/29/2016 Document Reviewed: 07/29/2016  Elsevier Interactive Patient Education  2018 Elsevier Inc.

## 2017-09-07 ENCOUNTER — Encounter: Payer: Self-pay | Admitting: Family Medicine

## 2017-11-17 ENCOUNTER — Ambulatory Visit: Payer: BLUE CROSS/BLUE SHIELD | Admitting: Adult Health

## 2017-11-21 ENCOUNTER — Ambulatory Visit (INDEPENDENT_AMBULATORY_CARE_PROVIDER_SITE_OTHER): Payer: BLUE CROSS/BLUE SHIELD | Admitting: Adult Health

## 2017-11-21 ENCOUNTER — Encounter: Payer: Self-pay | Admitting: Adult Health

## 2017-11-21 VITALS — BP 120/70 | Temp 98.2°F | Wt 147.0 lb

## 2017-11-21 DIAGNOSIS — L639 Alopecia areata, unspecified: Secondary | ICD-10-CM | POA: Diagnosis not present
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
## 2019-01-11 ENCOUNTER — Encounter: Payer: Self-pay | Admitting: Adult Health

## 2019-01-11 ENCOUNTER — Ambulatory Visit (INDEPENDENT_AMBULATORY_CARE_PROVIDER_SITE_OTHER): Payer: BC Managed Care – PPO | Admitting: Adult Health

## 2019-01-11 ENCOUNTER — Other Ambulatory Visit: Payer: Self-pay

## 2019-01-11 VITALS — BP 130/82 | Temp 98.1°F | Wt 161.0 lb

## 2019-01-11 DIAGNOSIS — Z23 Encounter for immunization: Secondary | ICD-10-CM

## 2019-01-11 DIAGNOSIS — L918 Other hypertrophic disorders of the skin: Secondary | ICD-10-CM

## 2019-01-11 NOTE — Progress Notes (Signed)
S: The patient complains of symptomatic skin tags on the neck These are irritated by clothing, jewelry and rubbing.  O: Patient appears well. Several benign skin tags are noted on the neck.   A: Skin tags   P: Five skin tags are snipped off using Betadine for cleansing and sterile iris scissors. Cold spray was used. These pathognomonic lesions are not sent for pathology.Patient tolerated procedure well    Kenneth Peng, NP

## 2019-01-16 ENCOUNTER — Encounter: Payer: Self-pay | Admitting: Adult Health

## 2019-03-21 ENCOUNTER — Encounter: Payer: BC Managed Care – PPO | Admitting: Adult Health

## 2022-05-27 ENCOUNTER — Encounter: Payer: Self-pay | Admitting: Adult Health

## 2022-05-27 ENCOUNTER — Ambulatory Visit (INDEPENDENT_AMBULATORY_CARE_PROVIDER_SITE_OTHER): Payer: Commercial Managed Care - PPO | Admitting: Adult Health

## 2022-05-27 ENCOUNTER — Ambulatory Visit (INDEPENDENT_AMBULATORY_CARE_PROVIDER_SITE_OTHER): Payer: Commercial Managed Care - PPO

## 2022-05-27 VITALS — BP 110/80 | HR 70 | Temp 98.5°F | Ht 66.0 in | Wt 155.0 lb

## 2022-05-27 DIAGNOSIS — R109 Unspecified abdominal pain: Secondary | ICD-10-CM | POA: Diagnosis not present

## 2022-05-27 DIAGNOSIS — R630 Anorexia: Secondary | ICD-10-CM

## 2022-05-27 DIAGNOSIS — R1013 Epigastric pain: Secondary | ICD-10-CM | POA: Diagnosis not present

## 2022-05-27 DIAGNOSIS — R634 Abnormal weight loss: Secondary | ICD-10-CM | POA: Diagnosis not present

## 2022-05-27 LAB — COMPREHENSIVE METABOLIC PANEL
ALT: 43 U/L (ref 0–53)
AST: 25 U/L (ref 0–37)
Albumin: 4.6 g/dL (ref 3.5–5.2)
Alkaline Phosphatase: 48 U/L (ref 39–117)
BUN: 18 mg/dL (ref 6–23)
CO2: 29 mEq/L (ref 19–32)
Calcium: 9.7 mg/dL (ref 8.4–10.5)
Chloride: 104 mEq/L (ref 96–112)
Creatinine, Ser: 0.74 mg/dL (ref 0.40–1.50)
GFR: 115.58 mL/min (ref >60.00)
Glucose, Bld: 93 mg/dL (ref 70–99)
Potassium: 4.4 mEq/L (ref 3.5–5.1)
Sodium: 138 mEq/L (ref 135–145)
Total Bilirubin: 0.6 mg/dL (ref 0.2–1.2)
Total Protein: 7.4 g/dL (ref 6.0–8.3)

## 2022-05-27 LAB — URINALYSIS
Bilirubin Urine: NEGATIVE
Hgb urine dipstick: NEGATIVE
Ketones, ur: NEGATIVE
Leukocytes,Ua: NEGATIVE
Nitrite: NEGATIVE
Specific Gravity, Urine: 1.015 (ref 1.000–1.030)
Total Protein, Urine: NEGATIVE
Urine Glucose: NEGATIVE
Urobilinogen, UA: 0.2 (ref 0.0–1.0)
pH: 6.5 (ref 5.0–8.0)

## 2022-05-27 LAB — CBC WITH DIFFERENTIAL/PLATELET
Basophils Absolute: 0 10*3/uL (ref 0.0–0.1)
Basophils Relative: 0.4 % (ref 0.0–3.0)
Eosinophils Absolute: 0.1 10*3/uL (ref 0.0–0.7)
Eosinophils Relative: 0.7 % (ref 0.0–5.0)
HCT: 46.5 % (ref 39.0–52.0)
Hemoglobin: 15 g/dL (ref 13.0–17.0)
Lymphocytes Relative: 29.4 % (ref 12.0–46.0)
Lymphs Abs: 2.5 10*3/uL (ref 0.7–4.0)
MCHC: 32.4 g/dL (ref 30.0–36.0)
MCV: 80.9 fl (ref 78.0–100.0)
Monocytes Absolute: 0.6 10*3/uL (ref 0.1–1.0)
Monocytes Relative: 7.7 % (ref 3.0–12.0)
Neutro Abs: 5.2 10*3/uL (ref 1.4–7.7)
Neutrophils Relative %: 61.8 % (ref 43.0–77.0)
Platelets: 251 10*3/uL (ref 150.0–400.0)
RBC: 5.74 Mil/uL (ref 4.22–5.81)
RDW: 13.6 % (ref 11.5–15.5)
WBC: 8.4 10*3/uL (ref 4.0–10.5)

## 2022-05-27 LAB — TSH: TSH: 0.37 u[IU]/mL (ref 0.35–5.50)

## 2022-05-27 MED ORDER — PANTOPRAZOLE SODIUM 40 MG PO TBEC: 40.0000 mg | DELAYED_RELEASE_TABLET | Freq: Every day | ORAL | 0 refills | Status: AC

## 2022-05-27 MED ORDER — SUCRALFATE 1 GM/10ML PO SUSP: 1.0000 g | Freq: Three times a day (TID) | ORAL | 0 refills | Status: AC
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
## 2022-05-27 NOTE — Patient Instructions (Signed)
Today we will do some labs and an xray   I have sent in 30 days of a medication called Protonix   We may have to do a CT scan of the abdomen but will wait for xray and labs to come back   Follow up in 30 days or sooner if needed

## 2022-06-09 ENCOUNTER — Encounter: Payer: Self-pay | Admitting: Adult Health

## 2022-06-10 ENCOUNTER — Ambulatory Visit (INDEPENDENT_AMBULATORY_CARE_PROVIDER_SITE_OTHER): Payer: Commercial Managed Care - PPO | Admitting: Family

## 2022-06-10 ENCOUNTER — Other Ambulatory Visit: Payer: Self-pay | Admitting: Family

## 2022-06-10 VITALS — BP 124/78 | HR 69 | Temp 97.9°F | Resp 18 | Ht 66.0 in | Wt 155.6 lb

## 2022-06-10 DIAGNOSIS — U071 COVID-19: Secondary | ICD-10-CM | POA: Diagnosis not present
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
## 2022-06-10 NOTE — Patient Instructions (Signed)

## 2023-08-15 ENCOUNTER — Telehealth: Payer: Self-pay | Admitting: Adult Health

## 2023-08-15 NOTE — Telephone Encounter (Signed)
Lmom for pt to sch ov or cpe been over 1 yr

## 2023-12-29 ENCOUNTER — Encounter: Admitting: Adult Health

## 2024-01-12 ENCOUNTER — Encounter: Admitting: Adult Health

## 2024-01-19 ENCOUNTER — Encounter: Admitting: Adult Health

## 2024-02-02 ENCOUNTER — Ambulatory Visit: Payer: Self-pay | Admitting: Adult Health

## 2024-02-02 ENCOUNTER — Encounter: Payer: Self-pay | Admitting: Adult Health

## 2024-02-02 ENCOUNTER — Ambulatory Visit (INDEPENDENT_AMBULATORY_CARE_PROVIDER_SITE_OTHER): Admitting: Adult Health

## 2024-02-02 VITALS — BP 138/80 | HR 66 | Temp 98.3°F | Ht 66.0 in | Wt 161.0 lb

## 2024-02-02 DIAGNOSIS — E782 Mixed hyperlipidemia: Secondary | ICD-10-CM

## 2024-02-02 DIAGNOSIS — Z0000 Encounter for general adult medical examination without abnormal findings: Secondary | ICD-10-CM

## 2024-02-02 DIAGNOSIS — Z87898 Personal history of other specified conditions: Secondary | ICD-10-CM

## 2024-02-02 LAB — HEMOGLOBIN A1C: Hgb A1c MFr Bld: 6.1 % (ref 4.6–6.5)

## 2024-02-02 LAB — COMPREHENSIVE METABOLIC PANEL WITH GFR
ALT: 31 U/L (ref 0–53)
AST: 19 U/L (ref 0–37)
Albumin: 4.6 g/dL (ref 3.5–5.2)
Alkaline Phosphatase: 46 U/L (ref 39–117)
BUN: 21 mg/dL (ref 6–23)
CO2: 29 meq/L (ref 19–32)
Calcium: 9.2 mg/dL (ref 8.4–10.5)
Chloride: 104 meq/L (ref 96–112)
Creatinine, Ser: 0.83 mg/dL (ref 0.40–1.50)
GFR: 110.33 mL/min (ref >60.00)
Glucose, Bld: 97 mg/dL (ref 70–99)
Potassium: 4.3 meq/L (ref 3.5–5.1)
Sodium: 138 meq/L (ref 135–145)
Total Bilirubin: 0.5 mg/dL (ref 0.2–1.2)
Total Protein: 7.1 g/dL (ref 6.0–8.3)

## 2024-02-02 LAB — LIPID PANEL
Cholesterol: 205 mg/dL — ABNORMAL HIGH (ref 0–200)
HDL: 46.8 mg/dL (ref >39.00)
LDL Cholesterol: 136 mg/dL — ABNORMAL HIGH (ref 0–99)
NonHDL: 158.28
Total CHOL/HDL Ratio: 4
Triglycerides: 109 mg/dL (ref 0.0–149.0)
VLDL: 21.8 mg/dL (ref 0.0–40.0)

## 2024-02-02 LAB — CBC
HCT: 45.4 % (ref 39.0–52.0)
Hemoglobin: 14.9 g/dL (ref 13.0–17.0)
MCHC: 32.9 g/dL (ref 30.0–36.0)
MCV: 80.1 fl (ref 78.0–100.0)
Platelets: 243 K/uL (ref 150.0–400.0)
RBC: 5.67 Mil/uL (ref 4.22–5.81)
RDW: 14.3 % (ref 11.5–15.5)
WBC: 8.5 K/uL (ref 4.0–10.5)

## 2024-02-02 LAB — TSH: TSH: 0.49 u[IU]/mL (ref 0.35–5.50)
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
## 2024-02-06 NOTE — Telephone Encounter (Signed)
**Note De-identified  [place] Obfuscation** Please advise 

## 2024-06-07 ENCOUNTER — Other Ambulatory Visit: Payer: Self-pay

## 2024-06-07 ENCOUNTER — Ambulatory Visit: Admitting: Adult Health

## 2024-06-07 ENCOUNTER — Ambulatory Visit: Admission: RE | Admit: 2024-06-07 | Discharge: 2024-06-07 | Disposition: A | Discharge status: Home / Self Care

## 2024-06-07 VITALS — BP 151/92 | HR 97 | Temp 98.7°F | Resp 16 | Ht 66.0 in | Wt 165.0 lb

## 2024-06-07 DIAGNOSIS — H9312 Tinnitus, left ear: Secondary | ICD-10-CM | POA: Diagnosis not present

## 2024-06-07 DIAGNOSIS — H6592 Unspecified nonsuppurative otitis media, left ear: Secondary | ICD-10-CM | POA: Diagnosis not present

## 2024-06-07 MED ORDER — PREDNISONE 20 MG PO TABS: 40.0000 mg | ORAL_TABLET | Freq: Every day | ORAL | 0 refills | Status: DC

## 2024-06-07 NOTE — ED Triage Notes (Signed)
 Pt presents with a chief complaint of intermittent left ear pain x 5 days. Also endorses tinnitus and nausea. Currently rates ear pain a 3/10. Pt voices he has been using ear plugs at work + exposed to loud noises. Initially thought it was his allergies.
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
## 2024-06-07 NOTE — Discharge Instructions (Signed)
  1. Fluid level behind tympanic membrane of left ear (Primary) 2. Subjective tinnitus of left ear - predniSONE (DELTASONE) 20 MG tablet; Take 2 tablets (40 mg total) by mouth daily for 5 days.  Dispense: 10 tablet; Refill: 0 - Ambulatory referral to Audiology for further evaluation of tinnitus and left ear pressure/pain. -Continue to monitor symptoms for any change in severity if there is any escalation of current symptoms or development of new symptoms follow-up in ER for further evaluation and management.

## 2024-06-09 ENCOUNTER — Telehealth: Payer: Self-pay | Admitting: Physician Assistant

## 2024-06-09 MED ORDER — PREDNISONE 20 MG PO TABS: 40.0000 mg | ORAL_TABLET | Freq: Every day | ORAL | 0 refills | Status: AC

## 2024-06-09 NOTE — Telephone Encounter (Signed)
 Pt called clinic requesting refill of prednisone stating that one of his children misplaced his prescription.  Prescription was sent in on 06/07/2024.  Will send prescription for prednisone 40 mg p.o. daily x 3 days as that should be the remainder that he is missing.

## 2024-06-14 ENCOUNTER — Telehealth (HOSPITAL_COMMUNITY): Payer: Self-pay

## 2024-06-14 NOTE — Telephone Encounter (Signed)
 I am sure he meant for regular audiology testing, no sedation

## 2024-06-14 NOTE — Telephone Encounter (Signed)
 Received call Audiology at [address], stating pt had called them regarding referral. Per staff, the referral appears to have been put in for Acute Rehab where sedated testing is performed. Staff at Audiology is requesting clarification if pt should be referred to them or to Acute Rehab. Callback number is [phone].  Thanks.

## 2024-07-08 ENCOUNTER — Ambulatory Visit: Admitting: Audiologist

## 2024-07-08 ENCOUNTER — Encounter: Payer: Self-pay | Admitting: Audiologist

## 2024-07-08 DIAGNOSIS — H833X2 Noise effects on left inner ear: Secondary | ICD-10-CM | POA: Insufficient documentation

## 2024-07-08 DIAGNOSIS — H9312 Tinnitus, left ear: Secondary | ICD-10-CM

## 2024-07-08 NOTE — Procedures (Signed)
°  Outpatient Audiology and [facility] [address] [place], [place]  [address] [phone]  AUDIOLOGICAL  EVALUATION  NAME: Kenneth Mcdonald     DOB:   May 20, 1986      MRN: [id number]                                                                                     DATE: 07/08/2024     REFERENT: Merna Huxley, NP STATUS: Outpatient DIAGNOSIS: Tinnitus Left Ear, Hazardous Noise Exposure   History: Kenneth Mcdonald was seen for an audiological evaluation due to ringing in the left and sensitivity after several hours of exposure to noise. 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 He then was exposed to several hours of high pitched sound. He had pain, muffled hearing, and tinnitus for several weeks. He went to Urgent Care and was given medication. The hearing as returned to the left ear. He is still sensitive to high pitched sounds and has intermittent tinnitus.  Evaluation:  Otoscopy showed a clear view of the tympanic membranes, bilaterally Tympanometry results were consistent with normal middle ear function, bilaterally   Audiometric testing was completed using Conventional Audiometry techniques with insert earphones and supraural headphones. Test results are consistent with normal hearing bilaterally. Speech Recognition Thresholds were obtained at  0dB HL in the right ear and at 5dB HL in the left ear. Word Recognition Testing was completed at  40dB SL and Kenneth Mcdonald scored 100% in each ear.   Results:  The test results were reviewed with Kenneth Mcdonald. His left ear likely had hazardous noise exposure. He has normal hearing in both ears. There is no significant asymmetry. His middle ears show no signs of infection or damage to eardrum.    Recommendations: Kenneth Mcdonald needs to wear an ear plug in the left ear at work when exposed to noise. Recovery to noise exposure can take a long time.  Do not fixate on the tinnitus. Use masking to cover tinnitus during times of quiet  when its bothersome.    29 minutes spent testing and counseling on results.   If you have any questions please feel free to contact me at [phone].  Lauraine Ka Stalnaker Au.D.  Audiologist   07/08/2024  1:21 PM  Cc: Nafziger, Cory, NP
